# Patient Record
Sex: Female | Born: 1960 | Race: White | Hispanic: No | Smoking: Never smoker
Health system: Southern US, Community
[De-identification: ages and names within clinical notes are randomized; demographics above are authoritative.]

---

## 2015-12-06 ENCOUNTER — Ambulatory Visit (INDEPENDENT_AMBULATORY_CARE_PROVIDER_SITE_OTHER): Payer: BLUE CROSS/BLUE SHIELD | Admitting: Nurse Practitioner

## 2015-12-06 ENCOUNTER — Encounter (INDEPENDENT_AMBULATORY_CARE_PROVIDER_SITE_OTHER): Payer: Self-pay

## 2015-12-06 VITALS — BP 152/98 | HR 98 | Temp 98.8°F | Resp 16 | Wt 223.4 lb

## 2015-12-06 DIAGNOSIS — M5432 Sciatica, left side: Secondary | ICD-10-CM

## 2015-12-06 MED ORDER — TRAMADOL HCL 50 MG PO TABS
50.0000 mg | ORAL_TABLET | Freq: Three times a day (TID) | ORAL | Status: DC | PRN
Start: 2015-12-06 — End: 2016-07-24

## 2015-12-06 MED ORDER — NAPROXEN 500 MG PO TABS
500.0000 mg | ORAL_TABLET | Freq: Two times a day (BID) | ORAL | Status: DC
Start: 2015-12-06 — End: 2016-07-24

## 2015-12-06 MED ORDER — CYCLOBENZAPRINE HCL 10 MG PO TABS
10.0000 mg | ORAL_TABLET | Freq: Three times a day (TID) | ORAL | Status: DC | PRN
Start: 2015-12-06 — End: 2016-07-24

## 2015-12-06 NOTE — Progress Notes (Signed)
[facility]  PROGRESS NOTE     Patient: Alexandria Wagner   Date: 12/06/2015   MRN: [id number]       Alexandria Wagner is a 55 y.o. female      SUBJECTIVE     Chief Complaint   Patient presents with   . Hip Pain     Onset a few weeks ago with left hip pain that radiates to left knee. Pt states pain is exacerbated when sitting for long periods and upon standing.  Self tx with ibuprofen with relief.          HPI Comments: Patient reports over the last 3 weeks.  She has had pain in the left buttock that radiates down to her left knee  She reports no injury or trauma  Pain is burning/shooting in nature  She is using ibuprofen intermittently along with ice with mild relief  She denies previous injury to area  She cannot recall an injury that she did to create the pain    Reviewed history of patients medical, surgical, family and social with patient - appropriate changes made to template      Hip Pain   The incident occurred more than 1 week ago. The incident occurred at home. There was no injury mechanism. The pain is present in the left hip (Left buttock). The quality of the pain is described as aching and shooting. The pain is at a severity of 7/10. The pain is moderate. The pain has been intermittent since onset. Pertinent negatives include no inability to bear weight, loss of motion, loss of sensation, muscle weakness, numbness or tingling. She reports no foreign bodies present. The symptoms are aggravated by movement and weight bearing. She has tried ice and NSAIDs for the symptoms. The treatment provided mild relief.       Review of Systems   Constitutional: Negative.    HENT: Negative.    Respiratory: Negative.    Cardiovascular: Negative.    Musculoskeletal: Positive for myalgias and gait problem.   Skin: Negative for color change and rash.   Allergic/Immunologic: Negative for immunocompromised state.   Neurological: Negative for tingling and numbness.   Hematological: Negative.    Psychiatric/Behavioral:  Negative.        The following portions of the patient's history were reviewed and updated as appropriate: Allergies, Current Medications, Past Family History, Past Medical history, Past social history, Past surgical history, and Problem List.    OBJECTIVE     Vitals   Filed Vitals:    12/06/15 1415   BP: 152/98   Pulse: 98   Temp: 98.8 F (37.1 C)   TempSrc: Tympanic   Resp: 16   Weight: 101.334 kg (223 lb 6.4 oz)       Physical Exam   Nursing note and vitals reviewed.  Constitutional: She is oriented to person, place, and time. She appears well-developed and well-nourished. She appears distressed.   Cardiovascular: Normal rate, regular rhythm, S1 normal, S2 normal and normal heart sounds.  Exam reveals no gallop and no friction rub.    No murmur heard.  Pulmonary/Chest: Effort normal and breath sounds normal. No respiratory distress. She has no wheezes. She has no rhonchi. She has no rales.   Abdominal: Soft. She exhibits no distension. There is no tenderness. There is no guarding.   Musculoskeletal:        Lumbar back: She exhibits decreased range of motion, tenderness and pain. She exhibits no bony tenderness, no swelling, no  edema and no spasm.        Back:    Neurological: She is alert and oriented to person, place, and time.   Skin: Skin is warm and dry. No rash noted.   Psychiatric: She has a normal mood and affect. Her behavior is normal. Judgment and thought content normal.         ASSESSMENT     Encounter Diagnosis   Name Primary?   . Sciatica of left side Yes     Left buttock and hip pain x 2-3 weeks    1. Sciatica of left side  naproxen (NAPROSYN) 500 MG tablet    traMADol (ULTRAM) 50 MG tablet    cyclobenzaprine (FLEXERIL) 10 MG tablet          PLAN     Procedures    1. Sciatica of left side  - naproxen (NAPROSYN) 500 MG tablet; Take 1 tablet (500 mg total) by mouth 2 (two) times daily with meals.  Dispense: 28 tablet; Refill: 0  - traMADol (ULTRAM) 50 MG tablet; Take 1 tablet (50 mg total) by mouth  every 8 (eight) hours as needed for Pain.  Dispense: 15 tablet; Refill: 0  - cyclobenzaprine (FLEXERIL) 10 MG tablet; Take 1 tablet (10 mg total) by mouth every 8 (eight) hours as needed for Muscle spasms.  Dispense: 20 tablet; Refill: 0    You have been evaluated for inflammation of her sciatic nerve  Use ibuprofen 600 mg every 6-8 hours as needed for inflammation.  You must take this medication with food or it will upset your stomach  Use the Flexeril one tablet at night to help sleep - DO NOT take this medication a few plan on driving it will make you sleepy  Use the Tramadol for severe pain - DO NOT drink alcohol or drive when taking as it will make you sleepy  Use heat on the area.  5-6 times a day 15-20 minutes at a time    Please follow-up with your primary care provider or orthopedist if not feeling any better in the next week.  Go to the Emergency Department for any worsening symptoms or concerns.      An After Visit Summary was printed and given to the patient.      Woodfin Ganja, FNP  12/06/2015

## 2015-12-06 NOTE — Patient Instructions (Addendum)
Dear Ms Harrie Jeans:    I appreciate your choosing the [facility] in [facility] for your healthcare needs, and hope your visit today was EXCELLENT.    Instructions:    You have been evaluated for inflammation of her sciatic nerve  Use ibuprofen 600 mg every 6-8 hours as needed for inflammation.  You must take this medication with food or it will upset your stomach  Use the Flexeril one tablet at night to help sleep - DO NOT take this medication a few plan on driving it will make you sleepy  Use the Tramadol for severe pain - DO NOT drink alcohol or drive when taking as it will make you sleepy  Use heat on the area.  5-6 times a day 15-20 minutes at a time    Please follow-up with your primary care provider or orthopedist if not feeling any better in the next week.  Go to the Emergency Department for any worsening symptoms or concerns.    Below is some information that our patients often find helpful.    We wish you good health and please do not hesitate to contact us if we can ever be of any assistance.    Sincerely,  Zachary George. Renae Fickle, FNP-BC  [facility]                        Sciatica    Sciatica is a condition that causes pain in the lower back that spreads down into the buttock, hip, and leg. Sometimes the leg pain can happen without any back pain. Sciatica happens when a spinal nerve is irritated or has pressure put on it as comes out of the spinal canal in the lower back. This most often happens when a bulge or rupture of a nearby spinal disk presses on the nerve. Sciatica can also be caused by a narrowing of the spinal canal (spinal stenosis) or spasm of the muscle in the buttocks that the sciatic nerve passes through (pyriform muscle). Sciatica is also called lumbar radiculopathy.  Sciatica may begin after a sudden twisting or bending force, such as in a car accident. Or it can happen after a simple awkward movement. In either case, muscle spasm often also happens. Muscle spasm makes the  pain worse.  A healthcare provider makes a diagnosis of sciatica from your symptoms and a physical exam. Unless you had an injury from a car accident or fall, you usually won't have X-rays taken at this time. This is because the nerves and disks in your back can't be seen on an X-ray. If the provider sees signs of a compressed nerve, you will need to schedule an MRI scan as an outpatient. Signs of a compressed nerve include loss of strength in a leg.  Most sciatica gets better with medicine, exercise, and physical therapy. If your symptoms continue after at least 3 months of medical treatment, you may need surgery or injections to your lower back.  Home care  Follow these tips when caring for yourself at home:   You may need to stay in bed the first few days. But as soon as possible, begin sitting up or walking. This will help you avoid problems that come from staying in bed for long periods.   When in bed, try to find a position that is comfortable. A firm mattress is best. Try lying flat on your back with pillows under your knees. You can also try lying on  your side with your knees bent up toward your chest and a pillow between your knees.   Avoid sitting for long periods. This puts more stress on your lower back than standing or walking.   Use heat from a hot shower, hot bath, or heating pad to help ease pain. Massage can also help. You can also try using an ice pack. You can make your own ice pack by putting ice cubes in a plastic bag. Wrap the bag in a thin towel. Try both heat and cold to see which works best. Use the method that feels best for 20 minutes several times a day.   You may use acetaminophen or ibuprofen to ease pain, unless another pain medicine was prescribed. Note: If you have chronic liver or kidney disease, talk with your healthcare provider before taking these medicines. Also talk with your provider if you've had a stomach ulcer or gastrointestinal bleeding.   Use safe lifting methods.  Don't lift anything heavier than 15 pounds until all of the pain is gone.  Follow-up care  Follow up with your healthcare provider, or as advised. You may need physical therapy or additional tests.  If X-rays were taken, a radiologist will look at them. You will be told of any new findings that may affect your care.  When to seek medical advice  Call your healthcare provider right awayif any of these occur:   Pain gets worse even after taking prescribed medicine   Weakness or numbness in 1 or both legs or hips   Numbness in your groin or genital area   You can't control your bowel or bladder   Fever   Redness or swelling over your back or spine  Date Last Reviewed: 03/18/2015   2000-2016 [employer] [address] [place], [place] [address]. All rights reserved. This information is not intended as a substitute for professional medical care. Always follow your healthcare professional's instructions.

## 2016-02-21 ENCOUNTER — Ambulatory Visit (INDEPENDENT_AMBULATORY_CARE_PROVIDER_SITE_OTHER): Payer: BLUE CROSS/BLUE SHIELD | Admitting: Family

## 2016-02-21 ENCOUNTER — Encounter (INDEPENDENT_AMBULATORY_CARE_PROVIDER_SITE_OTHER): Payer: Self-pay | Admitting: Family

## 2016-02-21 VITALS — BP 137/100 | HR 73 | Temp 97.9°F | Resp 16 | Ht 66.0 in | Wt 200.0 lb

## 2016-02-21 DIAGNOSIS — J029 Acute pharyngitis, unspecified: Secondary | ICD-10-CM

## 2016-02-21 DIAGNOSIS — B029 Zoster without complications: Secondary | ICD-10-CM

## 2016-02-21 DIAGNOSIS — H9201 Otalgia, right ear: Secondary | ICD-10-CM

## 2016-02-21 LAB — POCT RAPID STREP A: Rapid Strep A Screen POCT: NEGATIVE

## 2016-02-21 MED ORDER — TRAMADOL HCL 50 MG PO TABS
50.0000 mg | ORAL_TABLET | Freq: Four times a day (QID) | ORAL | Status: DC | PRN
Start: 2016-02-21 — End: 2016-07-24

## 2016-02-21 MED ORDER — VALACYCLOVIR HCL 1 G PO TABS
1000.0000 mg | ORAL_TABLET | Freq: Three times a day (TID) | ORAL | Status: AC
Start: 2016-02-21 — End: 2016-02-28

## 2016-02-21 MED ORDER — LIDOCAINE VISCOUS 2 % MT SOLN
5.0000 mL | Freq: Four times a day (QID) | OROMUCOSAL | Status: DC | PRN
Start: 2016-02-21 — End: 2016-07-24

## 2016-02-21 MED ORDER — LIDOCAINE VISCOUS 2 % MT SOLN
10.0000 mL | Freq: Once | OROMUCOSAL | Status: AC
Start: 2016-02-21 — End: 2016-02-21
  Administered 2016-02-21: 10 mL via OROMUCOSAL

## 2016-02-21 NOTE — Patient Instructions (Signed)
Thank you for choosing [facility]  Drink plenty of fluid   Viscous lidocaine, gargle and spit   Please follow up with your primary care doctor in 3-4 days. .   Return to the clinic or Emergency Department for worsening symptoms.    Please remember to wash your hands it is the best way to reduce illness and the spread of germs.         Shingles  Shingles is a viral infection caused by the same virus as chicken pox. Anyone who has had chicken pox may get shingles later in life. The virus stays in the body, but remains dormant (asleep). Shingles often occurs in older persons or persons with lowered immunity. But it can affect anyone at any age.  Shingles starts as a tingling patch of skin on one side of the body. Small, painful blisters may then appear. The rash does not spread to the rest of the body.  Exposure to shingles cannot cause shingles. However, it can cause chicken pox in anyone who has not had chicken pox or has not been vaccinated. The contagious period ends when all blisters have crusted over (generally about 2 weeks after the illness begins).  After the blisters heal, the affected skin may be sensitive or painful for months (neuralgia). This often gradually goes away.  A shingles vaccine is available. This can help prevent shingles or make it less painful. It isgenerallyrecommended for adults over the age of 76 who have had chicken pox in the past, but who have never had shingles. Adults over 60 who have had neither chicken pox nor shingles can prevent both diseases with the chicken pox vaccine. Ask your healthcare provider about these vaccines.  Home care   Medicines may be prescribed to help relieve pain. Take these medicines as directed. Ask your healthcare provider or pharmacist before using over-the-counter medicines for helping treat pain and itching.   In certain cases, antiviral medicines may be prescribed to reduce pain, shorten the illness, and prevent neuralgia. Take these medicines  as directed.   Compresses made froma solution of cool water mixed with cornstarchorbaking sodamay help relieve pain and itching.   Gently wash skindailywith soap and water to help prevent infection. Be certain to rinse off all of the soap, which can be irritating.   Trim fingernails and try not to scratch. Scratching the sores may leave scars.   Stay home from work or school until all blisters have formed a crust and you are no longer contagious.  Follow-up care  Follow up with your healthcare provider or as directed by our staff.  When to seek medical advice   Fever of 100.39F (38C) or higher, or as directed by your healthcare provider   Affected skin is on the face or neck and any of the following occur:   Headache   Eye pain   Changes in vision   Sores near the eye   Weakness of facial muscles   Pain, redness, or swelling of a joint   Signs of skin infection: colored drainage from the sores, warmth, increasing redness, or increasing pain  Date Last Reviewed: 05/11/2014   2000-2016 [employer] [address] [place], [place] [address]. All rights reserved. This information is not intended as a substitute for professional medical care. Always follow your healthcare professional's instructions.                Pharyngitis (Sore Throat), Report Pending    Pharyngitis (sore  throat) is often due to a virus. It can also be caused by the streptococcus, or strep, bacterium, often called strep throat. Both viral and strep infectionscan cause throat pain that is worse when swallowing, aching all over with headache,and fever. Both types of infections are contagious. They may be spread by coughing, kissing,or touching others after touching your mouth or nose.  A test has been done to find out whetheryou (or your child, if your child is the patient) have strep throat. Call this facility or your healthcare provider if you were not given your test results. If the test ispositivefor strep  infection, you will need to takeantibiotic medicines. A prescription can be called into your pharmacy at that time. If the test isnegative,you probably have a viral pharyngitis. Thisdoes notneed to be treated with antibiotics.Until you receive the results of the strep test, you should stay home from work. If your child is being tested, he or she should stay home from school.  Home care   Rest at home. Drink plenty of fluids so you won't get dehydrated.   If the test is positive for strep, don't go towork or school for the first 2 days of taking the antibiotics. After this time, you will not be contagious. You can then return to work or school if you are feeling better.   Take the antibiotic medicinefor the full 10 days, even if you feel better. This is very important to make sure the infection is treated. It is also important to prevent drug-resistant germs from developing.If you were given an antibiotic shot, you won't needmore antibiotics.   For children:Use acetaminophen for fever, fussiness, or discomfort. In infants older than 43 months of age, you may use ibuprofen instead of acetaminophen.Talk with your child's healthcare provider before giving these medicines if yourchild has chronic liver or kidney disease or ever had a stomach ulcer or GI bleeding. Never give aspirin to a child under 3 years of age who is ill with a fever. It may cause severe liver damage.   For adults:Use acetaminophen or ibuprofen to control pain or fever, unless another medicine was prescribed for this. Talk with your healthcare provider before taking these medicinesif you have chronic liver or kidney disease or ever had a stomach ulcer or GI bleeding.   Use throat lozenges or numbing throat sprays to help reduce pain. Gargling with warm salt water will also help reduce throat pain. For this, dissolve 1/2 teaspoon of salt in 1 glass of warm water. To help soothe a sore throat, children can sip on juice or a  popsicle. Children 5 years and older can also suck on a lollipop or hard candy.   Don't eat salty or spicy foods. These can irritate the throat.  Follow-up care  Follow up with your healthcare provider or our staff if you don't get better over the next week.  When to seek medical advice  Call your healthcare provider right away if any of these occur:   Feveras directed by your healthcare provider. For children, seek care if:   Your child is of any age and has repeated fevers above 1063F (40C).   Your child is younger than 65 years of age and has a fever of 100.63F (38C) that continues for more than 1 day.   Your child is 23 years old or older and has a fever of 100.63F (38C) that continues for more than 3 days.   New or worsening ear pain, sinus pain, or headache  Painful lumps in the back of neck   Stiff neck   Lymph nodes are getting larger   Inability to swallow liquids, excessive drooling,or inability to open mouth wide due to throat pain   Signs of dehydration (very dark urine or no urine, sunken eyes, dizziness)   Trouble breathing or noisy breathing   Muffled voice   New rash   Child appears to be getting sicker  Date Last Reviewed: 11/27/2013   2000-2016 [employer] [address] [place], [place] [address]. All rights reserved. This information is not intended as a substitute for professional medical care. Always follow your healthcare professional's instructions.

## 2016-02-21 NOTE — Progress Notes (Signed)
[name]  PROGRESS NOTE     Patient: Alexandria Wagner   Date: 02/21/2016   MRN: [id number]       Alexandria Wagner is a 55 y.o. female      SUBJECTIVE     Chief Complaint   Patient presents with   . Otalgia     c/o right ear pain for the last 3 days. Self treat with hydrogen peroxide.   Patient complaint of right ear pain.    Patient is also complaining rash on mid gluteal area.  Noticed pain prior to rash starting.  Rash site is painful.       Otalgia   There is pain in the right ear. This is a new problem. The current episode started in the past 7 days. The problem has been rapidly worsening. There has been no fever. Associated symptoms include a rash. Pertinent negatives include no abdominal pain, coughing, diarrhea, ear discharge, headaches, hearing loss, neck pain, rhinorrhea, sore throat or vomiting. The treatment provided no relief.   Rash  This is a new problem. The current episode started in the past 7 days. The affected locations include the right buttock. The rash is characterized by blistering and pain. Pertinent negatives include no congestion, cough, diarrhea, fever, rhinorrhea, shortness of breath, sore throat or vomiting. Past treatments include nothing.       Review of Systems   Constitutional: Negative for fever, chills, diaphoresis and appetite change.   HENT: Positive for ear pain. Negative for congestion, ear discharge, hearing loss, postnasal drip, rhinorrhea, sinus pressure, sore throat and trouble swallowing.    Eyes: Negative for discharge.   Respiratory: Negative for cough, chest tightness, shortness of breath and wheezing.    Cardiovascular: Negative for chest pain.   Gastrointestinal: Negative for nausea, vomiting, abdominal pain and diarrhea.   Musculoskeletal: Negative for myalgias, arthralgias, neck pain and neck stiffness.   Skin: Positive for rash.   Allergic/Immunologic: Negative for environmental allergies.   Neurological: Negative for dizziness and headaches.   Hematological:  Negative for adenopathy.   Psychiatric/Behavioral: Negative for confusion and sleep disturbance.       The following portions of the patient's history were reviewed and updated as appropriate: Allergies, Current Medications, Past Family History, Past Medical history, Past social history, Past surgical history, and Problem List.    OBJECTIVE     Vitals   Filed Vitals:    02/21/16 1031   BP: 137/100   Pulse: 73   Temp: 97.9 F (36.6 C)   TempSrc: Tympanic   Resp: 16   Height: 1.676 m (5\' 6" )   Weight: 90.719 kg (200 lb)   SpO2: 98%       Physical Exam   Nursing note and vitals reviewed.  Constitutional: She is oriented to person, place, and time. She appears well-developed and well-nourished.   HENT:   Head: Normocephalic and atraumatic.   Right Ear: External ear normal.   Left Ear: External ear normal.   Nose: Nose normal.   Mouth/Throat: Posterior oropharyngeal erythema present. No oropharyngeal exudate.       Eyes: Pupils are equal, round, and reactive to light.   Neck: Normal range of motion. Neck supple.   Cardiovascular: Normal rate and regular rhythm.    Pulmonary/Chest: Effort normal and breath sounds normal. No respiratory distress. She has no wheezes. She exhibits no tenderness.   Abdominal: Soft. Bowel sounds are normal. She exhibits no distension and no mass. There is no tenderness. There is  no guarding.   Musculoskeletal: Normal range of motion. She exhibits no edema or tenderness.   Neurological: She is alert and oriented to person, place, and time. She has normal reflexes.   Skin: Skin is warm and dry. Rash noted. Rash is vesicular. No erythema.        Psychiatric: She has a normal mood and affect.       Lab Results (24 Hour)   Results     ** No results found for the last 24 hours. **          Radiology Results (24 Hour)     ** No results found for the last 24 hours. **          ASSESSMENT     No diagnosis found.       PLAN     Procedures    1. Herpes zoster without complication  Valtrex   Tramadol for  pain     2. Pharyngitis, unspecified etiology  - Rapid Group A Strep  - lidocaine viscous (XYLOCAINE) 2 % mouth solution 10 mL; Use as directed 10 mLs in the mouth or throat once.        3. Otalgia of right ear  - lidocaine viscous (XYLOCAINE) 2 % mouth solution 10 mL; Use as directed 10 mLs in the mouth or throat once.    Ear pain resolved after viscous lidocaine   Increase fluid intake   Viscous lidocaine       An After Visit Summary was printed and given to the patient.      Signed,  Rexford Maus, NP  02/21/2016

## 2016-07-18 ENCOUNTER — Encounter (INDEPENDENT_AMBULATORY_CARE_PROVIDER_SITE_OTHER): Payer: Self-pay

## 2016-07-24 ENCOUNTER — Encounter (INDEPENDENT_AMBULATORY_CARE_PROVIDER_SITE_OTHER): Payer: Self-pay | Admitting: Internal Medicine

## 2016-07-24 ENCOUNTER — Telehealth (INDEPENDENT_AMBULATORY_CARE_PROVIDER_SITE_OTHER): Payer: Self-pay | Admitting: Internal Medicine

## 2016-07-24 ENCOUNTER — Ambulatory Visit (FREE_STANDING_LABORATORY_FACILITY): Payer: BLUE CROSS/BLUE SHIELD | Admitting: Internal Medicine

## 2016-07-24 VITALS — BP 160/100 | HR 83 | Temp 98.0°F | Ht 62.99 in | Wt 218.0 lb

## 2016-07-24 DIAGNOSIS — Z853 Personal history of malignant neoplasm of breast: Secondary | ICD-10-CM

## 2016-07-24 DIAGNOSIS — R030 Elevated blood-pressure reading, without diagnosis of hypertension: Secondary | ICD-10-CM

## 2016-07-24 DIAGNOSIS — G44221 Chronic tension-type headache, intractable: Secondary | ICD-10-CM

## 2016-07-24 DIAGNOSIS — Z789 Other specified health status: Secondary | ICD-10-CM

## 2016-07-24 LAB — CBC AND DIFFERENTIAL
Absolute NRBC: 0 10*3/uL
Basophils Absolute Automated: 0.1 10*3/uL (ref 0.00–0.20)
Basophils Automated: 1.4 %
Eosinophils Absolute Automated: 0.12 10*3/uL (ref 0.00–0.70)
Eosinophils Automated: 1.7 %
Hematocrit: 41.9 % (ref 37.0–47.0)
Hgb: 13.9 g/dL (ref 12.0–16.0)
Immature Granulocytes Absolute: 0.02 10*3/uL
Immature Granulocytes: 0.3 %
Lymphocytes Absolute Automated: 1.43 10*3/uL (ref 0.50–4.40)
Lymphocytes Automated: 20.6 %
MCH: 29.3 pg (ref 28.0–32.0)
MCHC: 33.2 g/dL (ref 32.0–36.0)
MCV: 88.4 fL (ref 80.0–100.0)
MPV: 9.8 fL (ref 9.4–12.3)
Monocytes Absolute Automated: 0.6 10*3/uL (ref 0.00–1.20)
Monocytes: 8.6 %
Neutrophils Absolute: 4.68 10*3/uL (ref 1.80–8.10)
Neutrophils: 67.4 %
Nucleated RBC: 0 /100 WBC (ref 0.0–1.0)
Platelets: 312 10*3/uL (ref 140–400)
RBC: 4.74 10*6/uL (ref 4.20–5.40)
RDW: 13 % (ref 12–15)
WBC: 6.95 10*3/uL (ref 3.50–10.80)

## 2016-07-24 MED ORDER — CYCLOBENZAPRINE HCL 10 MG PO TABS
10.0000 mg | ORAL_TABLET | Freq: Three times a day (TID) | ORAL | 0 refills | Status: DC | PRN
Start: 2016-07-24 — End: 2016-07-31

## 2016-07-24 NOTE — Progress Notes (Signed)
Subjective:      Date: 07/24/2016 1:31 PM   Patient ID: Alexandria Wagner is a 55 y.o. female.    Chief Complaint:  Chief Complaint   Patient presents with   . Headache     x middle of nevember, not going away. Pt has been taking Motrin, wrapping her neck in ice and heat, she is also go to a chiropractor, she is having troule sleeping    . Sinusitis       HPI:55 year old woman here first office visit . Seen at urgent care last week with headache sinus pressure treated for sinusitis. Headache has persisted from back of neck up. No trauma or falls no fever no nausea or vomiting . No history of headaches . History of breast cancer s/p v lumpectomy and radiation. Works Health and safety inspector job. From [place] . Had pap . Colonoscopy. Getting chiropractor ajustments on neck for 1 week  HPI    Problem List:  Patient Active Problem List   Diagnosis   . Limb alert care status: Do Not Use Left ARM        Current Medications:  Current Outpatient Prescriptions   Medication Sig Dispense Refill   . aspirin 325 MG tablet Take 325 mg by mouth daily.     . cyclobenzaprine (FLEXERIL) 10 MG tablet Take 1 tablet (10 mg total) by mouth 3 (three) times daily as needed for Muscle spasms.May cause drowsiness 20 tablet 0     No current facility-administered medications for this visit.        Allergies:  No Known Allergies    Past Medical History:  History reviewed. No pertinent past medical history.    Past Surgical History:  Past Surgical History:   Procedure Laterality Date   . LUMPECTOMY      left breast   . TONSILLECTOMY AND ADENOIDECTOMY         Family History:  Family History   Problem Relation Age of Onset   . Breast cancer Mother    . No known problems Father    . Breast cancer Sister    . Breast cancer Maternal Grandmother    . Breast cancer Other        Social History:  Social History     Social History   . Marital status: Married     Spouse name: N/A   . Number of children: N/A   . Years of education: N/A     Occupational History   . Not on file.      Social History Main Topics   . Smoking status: Never Smoker   . Smokeless tobacco: Never Used   . Alcohol use Yes      Comment: occassionally   . Drug use: No   . Sexual activity: Not on file     Other Topics Concern   . Not on file     Social History Narrative   . No narrative on file       The following portions of the patient's history were reviewed and updated as appropriate: allergies, current medications, past family history, past medical history, past social history, past surgical history and problem list.    Vitals:  BP (!) 160/100 (BP Site: Right arm, Patient Position: Sitting, Cuff Size: Large)   Pulse 83   Temp 98 F (36.7 C) (Oral)   Ht 1.6 m (5' 2.99")   Wt 98.9 kg (218 lb)   SpO2 97%   BMI 38.63 kg/m  ROS:  Review of Systems   Constitutional: Negative.    Eyes: Negative.    Cardiovascular: Negative.    Gastrointestinal: Negative.    Genitourinary: Negative.    Musculoskeletal: Negative.    Skin: Negative.    Neurological: Positive for headaches.   Endo/Heme/Allergies: Negative.    Psychiatric/Behavioral: Negative.          Objective:       Physical Exam:  Physical Exam   Constitutional: She appears well-developed and well-nourished.   HENT:   Head: Normocephalic and atraumatic.   Neck: Normal range of motion. No JVD present.   Cardiovascular: Normal rate, regular rhythm and normal heart sounds.    Pulmonary/Chest: Effort normal.   Abdominal: Soft. Bowel sounds are normal.   Musculoskeletal: Normal range of motion. She exhibits no edema.   Neurological: She is alert.   Skin: Skin is warm and dry.   Psychiatric: She has a normal mood and affect.   Nursing note and vitals reviewed.        Assessment/Plan:       1. Limb alert care status: Do Not Use Left ARM     2. Chronic tension-type headache, intractable  - cyclobenzaprine (FLEXERIL) 10 MG tablet; Take 1 tablet (10 mg total) by mouth 3 (three) times daily as needed for Muscle spasms.May cause drowsiness  Dispense: 20 tablet; Refill:  0  - CBC and differential  - Comprehensive metabolic panel  - TSH  - Vitamin D,25 OH, Total  - Vitamin B12  - Sedimentation rate (ESR)  Tension headaches with sinus congestion , complete antibiotics , add flexeril qhs and cordicidin  . Check labs  Return 1 week  3. Elevated blood pressure reading  bp 150/90 high today no history of hypertension recheck here `1 week low salt no further advil     4. History of breast cancer stable          Clarnce Flock, MD

## 2016-07-24 NOTE — Telephone Encounter (Signed)
Pt thinks she missed the copay and would like to pay. Can be reached at [phone]

## 2016-07-24 NOTE — Progress Notes (Signed)
Have you seen any specialists/other providers since your last visit with us?    No      Limb alert protocol reviewed?      Yes

## 2016-07-25 LAB — COMPREHENSIVE METABOLIC PANEL
ALT: 15 U/L (ref 0–55)
AST (SGOT): 13 U/L (ref 5–34)
Albumin/Globulin Ratio: 1 (ref 0.9–2.2)
Albumin: 3.3 g/dL — ABNORMAL LOW (ref 3.5–5.0)
Alkaline Phosphatase: 65 U/L (ref 37–106)
BUN: 13 mg/dL (ref 7.0–19.0)
Bilirubin, Total: 0.3 mg/dL (ref 0.1–1.2)
CO2: 24 mEq/L (ref 21–29)
Calcium: 8.8 mg/dL (ref 8.5–10.5)
Chloride: 108 mEq/L (ref 100–111)
Creatinine: 0.9 mg/dL (ref 0.4–1.5)
Globulin: 3.3 g/dL (ref 2.0–3.7)
Glucose: 101 mg/dL — ABNORMAL HIGH (ref 70–100)
Potassium: 3.9 mEq/L (ref 3.5–5.1)
Protein, Total: 6.6 g/dL (ref 6.0–8.3)
Sodium: 142 mEq/L (ref 136–145)

## 2016-07-25 LAB — TSH: TSH: 1.17 u[IU]/mL (ref 0.35–4.94)

## 2016-07-25 LAB — SEDIMENTATION RATE: Sed Rate: 17 mm/Hr (ref 0–20)

## 2016-07-25 LAB — HEMOLYSIS INDEX: Hemolysis Index: 7 (ref 0–18)

## 2016-07-25 LAB — GFR: EGFR: >60

## 2016-07-25 LAB — VITAMIN D,25 OH,TOTAL: Vitamin D, 25 OH, Total: 44 ng/mL (ref 30–100)

## 2016-07-25 LAB — VITAMIN B12: Vitamin B-12: 802 pg/mL (ref 211–911)

## 2016-07-31 ENCOUNTER — Encounter (INDEPENDENT_AMBULATORY_CARE_PROVIDER_SITE_OTHER): Payer: Self-pay | Admitting: Internal Medicine

## 2016-07-31 ENCOUNTER — Ambulatory Visit (INDEPENDENT_AMBULATORY_CARE_PROVIDER_SITE_OTHER): Payer: BLUE CROSS/BLUE SHIELD | Admitting: Internal Medicine

## 2016-07-31 VITALS — HR 81 | Temp 98.2°F | Wt 219.8 lb

## 2016-07-31 DIAGNOSIS — F419 Anxiety disorder, unspecified: Secondary | ICD-10-CM

## 2016-07-31 DIAGNOSIS — R519 Headache, unspecified: Secondary | ICD-10-CM

## 2016-07-31 DIAGNOSIS — I10 Essential (primary) hypertension: Secondary | ICD-10-CM

## 2016-07-31 DIAGNOSIS — G44221 Chronic tension-type headache, intractable: Secondary | ICD-10-CM

## 2016-07-31 DIAGNOSIS — R51 Headache: Secondary | ICD-10-CM

## 2016-07-31 MED ORDER — LOSARTAN POTASSIUM 50 MG PO TABS
50.0000 mg | ORAL_TABLET | Freq: Every day | ORAL | 11 refills | Status: AC
Start: 2016-07-31 — End: ?

## 2016-07-31 MED ORDER — CYCLOBENZAPRINE HCL 10 MG PO TABS
10.0000 mg | ORAL_TABLET | Freq: Every evening | ORAL | 0 refills | Status: AC
Start: 2016-07-31 — End: ?

## 2016-07-31 NOTE — Progress Notes (Signed)
Have you seen any specialists/other providers since your last visit with [place]?    Yes, Chiropractor 07/27/16 & 07/29/16        Limb alert protocol reviewed?      Yes

## 2016-07-31 NOTE — Progress Notes (Signed)
Subjective:      Date: 07/31/2016 8:55 AM   Patient ID:  Lafalce is a 55 y.o. female.    Chief Complaint:  Chief Complaint   Patient presents with   . Headache     Patient stated that she went 4 days without a headache.    . Hypertension     f/u       HPI:  54 year old woman here  For follow up chronic headache. Let for 4 days but now returned . No history of persistant , cluster or migraine headaches. No nausea or vomiting. Also blood pressure high again   HPI    Problem List:  Patient Active Problem List   Diagnosis   . Limb alert care status: Do Not Use Left ARM        Current Medications:  Current Outpatient Prescriptions   Medication Sig Dispense Refill   . aspirin 325 MG tablet Take 325 mg by mouth daily.     . cyclobenzaprine (FLEXERIL) 10 MG tablet Take 1 tablet (10 mg total) by mouth 3 (three) times daily as needed for Muscle spasms.May cause drowsiness 20 tablet 0   . losartan (COZAAR) 50 MG tablet Take 1 tablet (50 mg total) by mouth daily. 30 tablet 11     No current facility-administered medications for this visit.        Allergies:  No Known Allergies    Past Medical History:  History reviewed. No pertinent past medical history.    Past Surgical History:  Past Surgical History:   Procedure Laterality Date   . LUMPECTOMY      left breast   . TONSILLECTOMY AND ADENOIDECTOMY         Family History:  Family History   Problem Relation Age of Onset   . Breast cancer Mother    . No known problems Father    . Breast cancer Sister    . Breast cancer Maternal Grandmother    . Breast cancer Other        Social History:  Social History     Social History   . Marital status: Divorced     Spouse name: N/A   . Number of children: N/A   . Years of education: N/A     Occupational History   . Not on file.     Social History Main Topics   . Smoking status: Never Smoker   . Smokeless tobacco: Never Used   . Alcohol use Yes      Comment: occassionally   . Drug use: No   . Sexual activity: Not on file     Other  Topics Concern   . Not on file     Social History Narrative   . No narrative on file       The following portions of the patient's history were reviewed and updated as appropriate: allergies, current medications, past family history, past medical history, past social history, past surgical history and problem list.    Vitals:  Pulse 81   Temp 98.2 F (36.8 C) (Oral)   Wt 99.7 kg (219 lb 12.8 oz)   SpO2 98%   BMI 38.95 kg/m       ROS:  Review of Systems   Constitutional: Negative.    Eyes: Negative.    Cardiovascular: Negative.    Gastrointestinal: Negative.    Genitourinary: Negative.    Musculoskeletal: Negative.    Skin: Negative.    Neurological: Positive for headaches.  Endo/Heme/Allergies: Negative.    Psychiatric/Behavioral: Negative.          Objective:       Physical Exam:  Physical Exam   Constitutional: She appears well-developed and well-nourished.   HENT:   Head: Normocephalic and atraumatic.   Neck: Normal range of motion. No JVD present.   Cardiovascular: Normal rate, regular rhythm and normal heart sounds.    Pulmonary/Chest: Effort normal.   Abdominal: Soft. Bowel sounds are normal.   Musculoskeletal: Normal range of motion. She exhibits no edema.   Neurological: She is alert.   Skin: Skin is warm and dry.   Psychiatric: She has a normal mood and affect.   Nursing note and vitals reviewed.        Assessment/Plan:       1. Essential hypertension  - losartan (COZAAR) 50 MG tablet; Take 1 tablet (50 mg total) by mouth daily.  Dispense: 30 tablet; Refill: 11  startr losartan 50 qd , low salt diet  Monitor at home recheck 2 weeks  2. Chronic intractable headache, unspecified headache type  - MRI Brain WO Contrast; Future  Image head because headache persist greater than 1 month . If negative and if headache persist see neurology        Clarnce Flock, MD

## 2016-08-03 ENCOUNTER — Telehealth (INDEPENDENT_AMBULATORY_CARE_PROVIDER_SITE_OTHER): Payer: Self-pay

## 2016-08-03 MED ORDER — ALPRAZOLAM 0.5 MG PO TABS
0.5000 mg | ORAL_TABLET | Freq: Two times a day (BID) | ORAL | 1 refills | Status: AC | PRN
Start: 2016-08-03 — End: ?

## 2016-08-03 NOTE — Telephone Encounter (Signed)
-----   Message from Barbette Merino sent at 08/03/2016  3:36 PM EST -----  Regarding: Claustrophobic Patient Needs Something to have MRI  Contact: [phone]  Patient is scheduled to have a MRI tomorrow and because she is claustrophobic she was told to call Dr. Renato Gails and ask him to prescribe something for her.  She uses the pharmacy on file.  She would like a call to confirm something was sent to the pharmacy.      Please call and advise.    Contact Number:  [phone]

## 2016-08-03 NOTE — Telephone Encounter (Signed)
Dr.Reed approved:      ALPRAZolam (XANAX) 0.5 MG tablet 30 tablet 1 08/03/2016     Sig - Route: Take 1 tablet (0.5 mg total) by mouth 2 (two) times daily as needed for Anxiety. - Oral    Class: Print    Associated Diagnoses     Anxiety       Pharmacy     [id number] - [place], [place] - [address] AT [address]         Done the Script above has been called in to [facility].     Patient has been notified of the approval.

## 2016-08-03 NOTE — Addendum Note (Signed)
Addended by: Lujean Rave T on: 08/03/2016 04:28 PM     Modules accepted: Orders

## 2016-08-04 ENCOUNTER — Ambulatory Visit: Payer: BLUE CROSS/BLUE SHIELD

## 2019-11-10 IMAGING — MR MR BREAST BILAT WO/W CM
8 of 11 series · 29 of 48 positions shown · IV contrast (10 GADAVIST)
Comparison: Previous exam(s).

CLINICAL DATA: 58-year-old female with history of treated left
breast cancer and right breast high risk lesion.

LABS:  None performed on site.
EXAM:
BILATERAL BREAST MRI WITH AND WITHOUT CONTRAST
TECHNIQUE: Multiplanar, multisequence MR images of both breasts were obtained
prior to and following the intravenous administration of 10 ml of
Gadavist.

[Series 3: T2 · axial · 3.0mm · 0.89mm/px · z∈[-75,+102]mm · 2 of 59 slices shown]
[im 1/59]
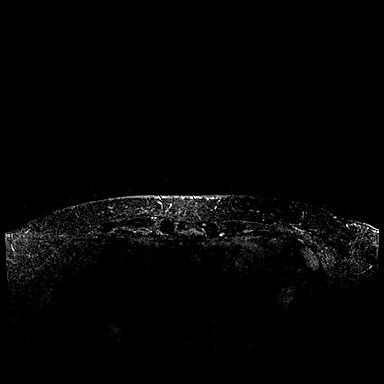
[im 59/59]
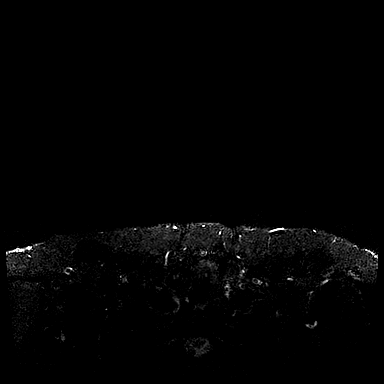

[Series 5: T1 fat-sat · axial · 1.2mm · 0.78mm/px · z∈[-73,+99]mm · 5 of 144 slices shown (1 of 5)]
[im 1/144]
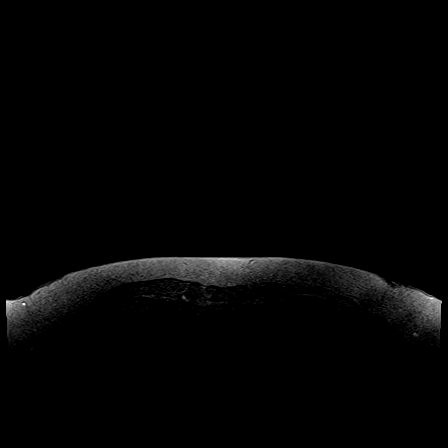
[im 36/144]
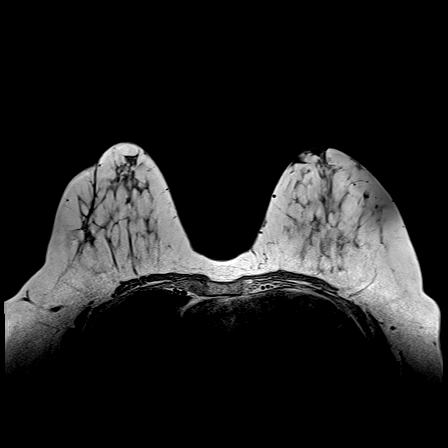
[im 72/144]
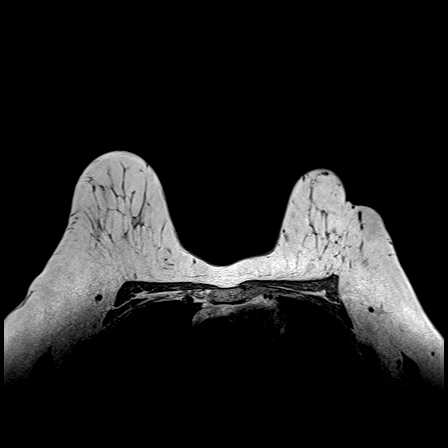
[im 108/144]
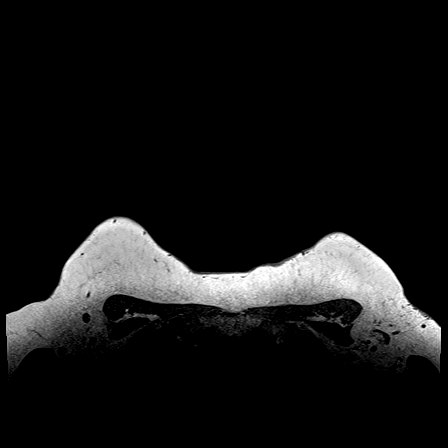
[im 144/144]
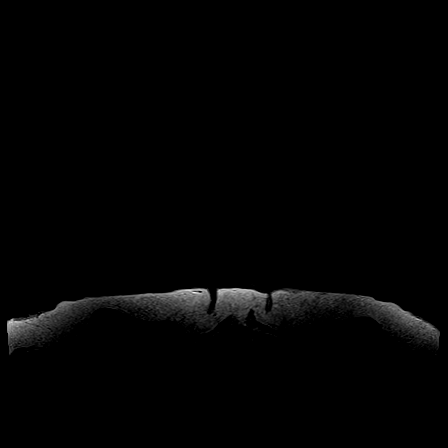

[Series 6: T1 fat-sat · axial · 1.6mm · 0.87mm/px · z∈[-79,+98]mm · 5 of 112 slices shown (2 of 5)]
[im 1/112]
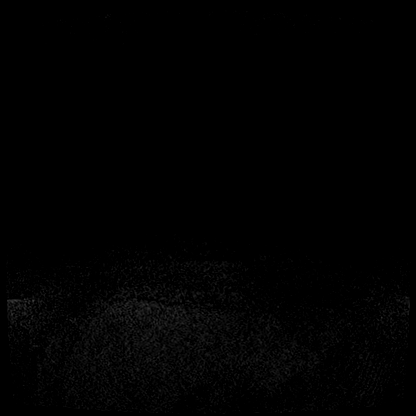
[im 28/112]
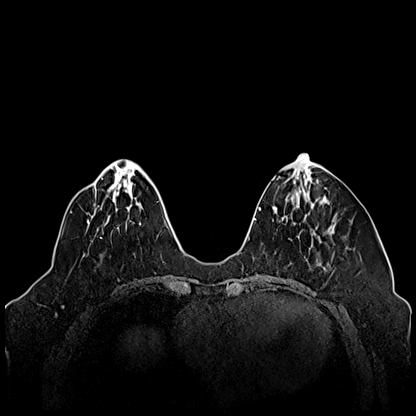
[im 56/112]
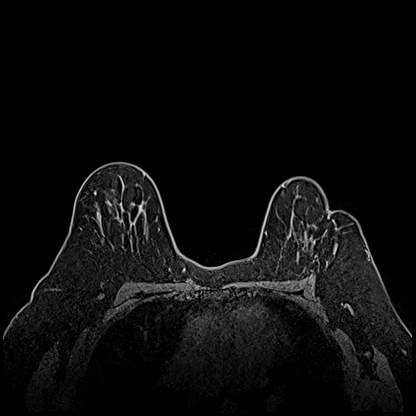
[im 84/112]
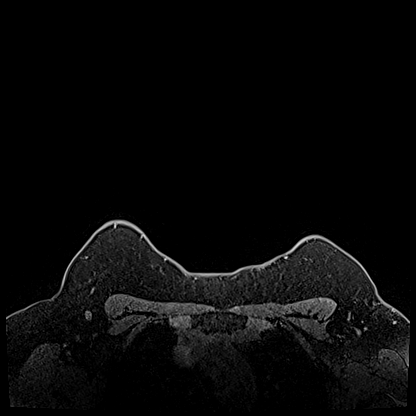
[im 112/112]
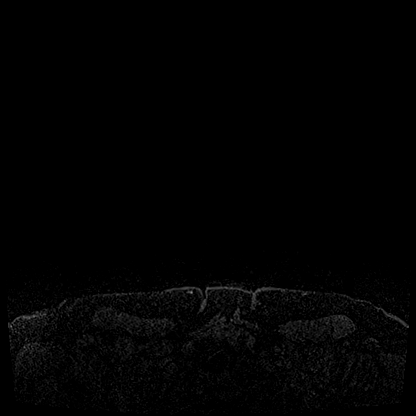

[Series 7: T1 fat-sat · axial · 1.6mm · 0.87mm/px · z∈[-79,+98]mm · 5 of 112 slices shown (3 of 5)]
[im 1/112]
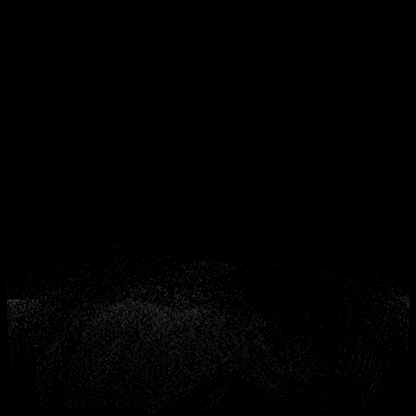
[im 28/112]
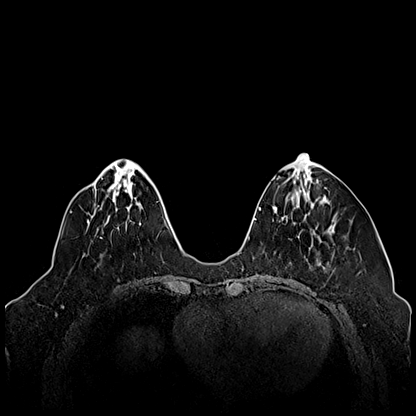
[im 56/112]
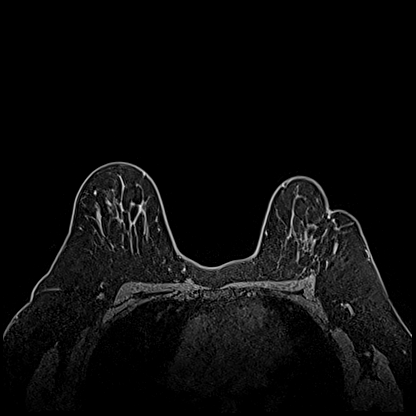
[im 84/112]
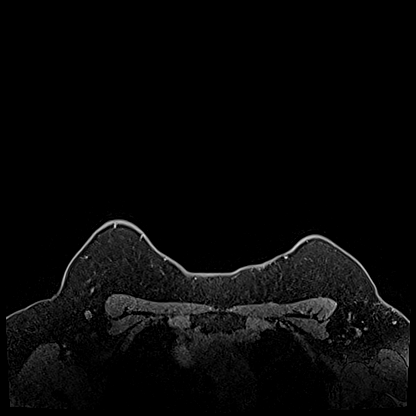
[im 112/112]
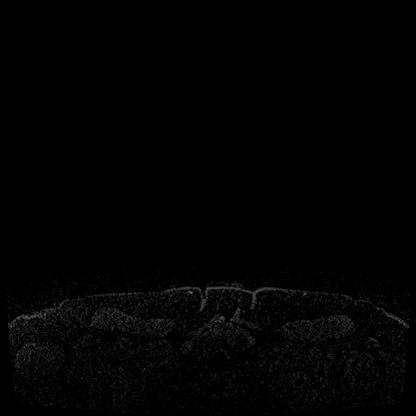

[Series 8: T1 fat-sat · axial · 1.6mm · 0.87mm/px · z∈[-79,+98]mm · 5 of 112 slices shown (4 of 5)]
[im 1/112]
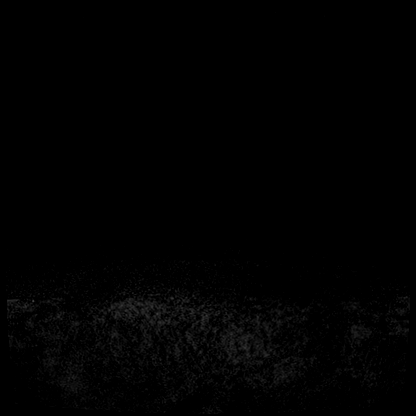
[im 28/112]
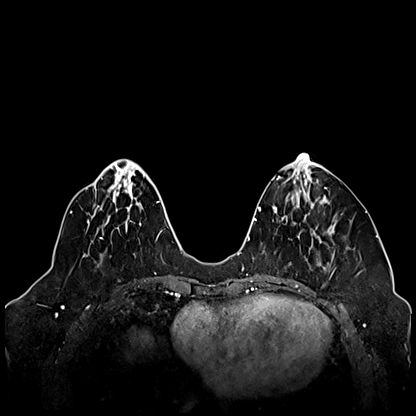
[im 56/112]
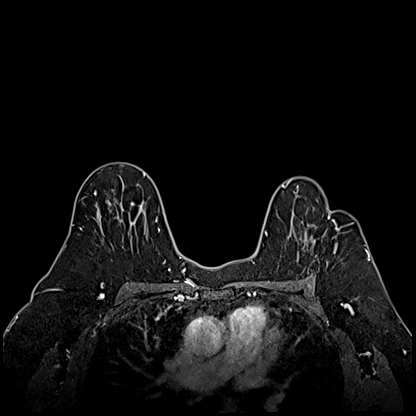
[im 84/112]
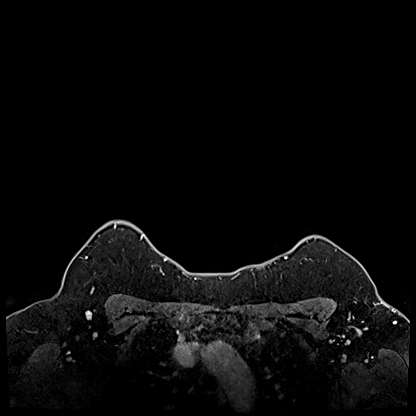
[im 112/112]
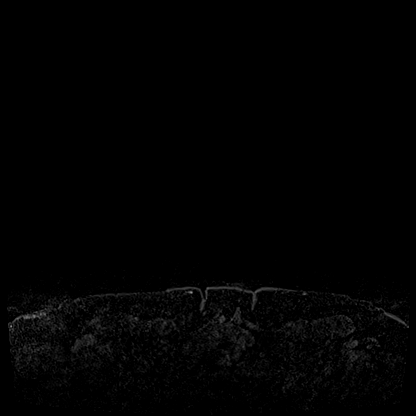

[Series 9: T1 · axial · 1.6mm · 0.87mm/px · z∈[-79,+98]mm · 5 of 112 slices shown (1 of 2)]
[im 1/112]
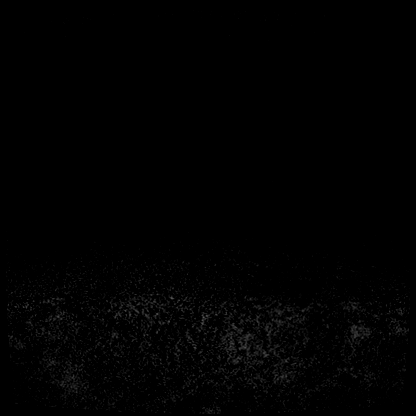
[im 28/112]
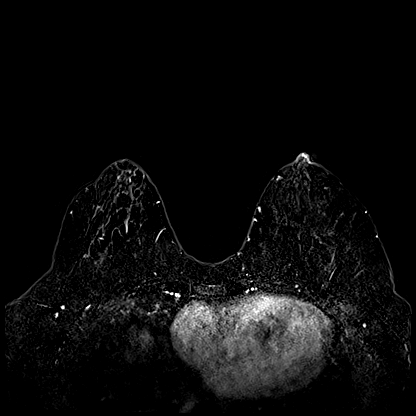
[im 56/112]
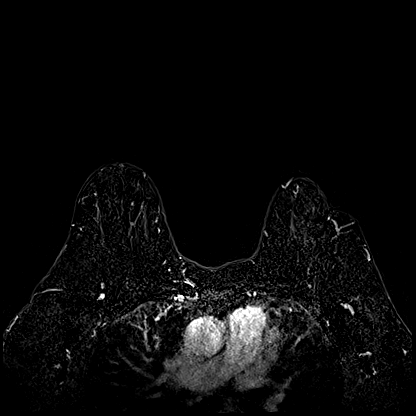
[im 84/112]
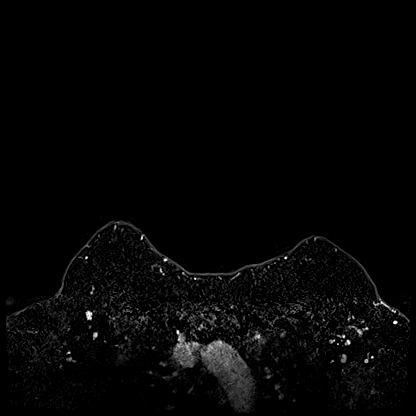
[im 112/112]
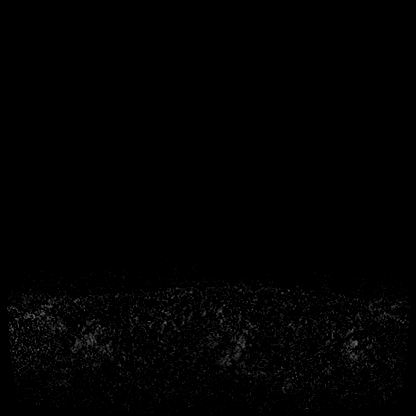

[Series 11: T1 · axial · 179.2mm · 0.87mm/px · 1 of 3 slices shown (2 of 2)]
[im 1/3]
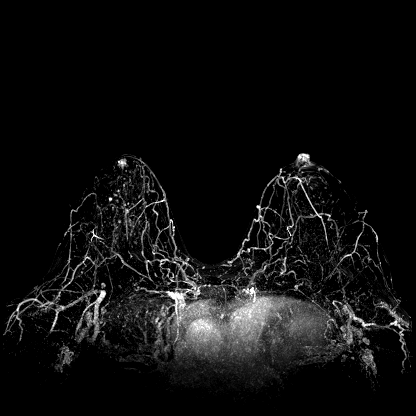

[Series 12: T1 fat-sat · axial · 1.6mm · 0.87mm/px · 1 of 112 slices shown (5 of 5)]
[im 1/112]
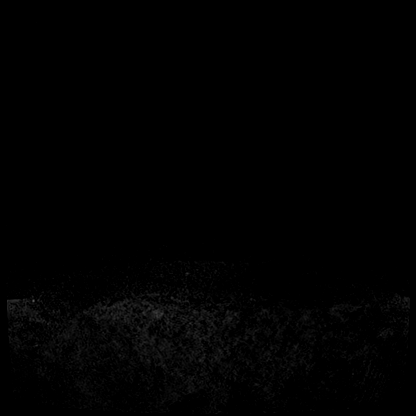

[29 of 48 positions shown; findings below may reference images not displayed]

Three-dimensional MR images were rendered by post-processing of the
original MR data on an independent workstation. The
three-dimensional MR images were interpreted, and findings are
reported in the following complete MRI report for this study. Three
dimensional images were evaluated at the independent Dyna[employer]
workstation
FINDINGS: Breast composition: b. Scattered fibroglandular tissue.

Background parenchymal enhancement: Mild.

Right breast: No suspicious mass or abnormal enhancement.
Postsurgical changes noted in the lower outer quadrant.

Left breast: No suspicious mass or abnormal enhancement.
Postsurgical changes noted in the upper outer quadrant.

Lymph nodes: No abnormal appearing lymph nodes.

Ancillary findings: 3.8 cm nonenhancing hepatic cyst in the right
lobe. No additional incidental findings.
IMPRESSION: Bilateral postsurgical changes without MRI evidence of malignancy in
either breast.

RECOMMENDATION:
Routine annual screening. The patient is due for her next bilateral
mammogram in [DATE].

BI-RADS CATEGORY  2: Benign.

## 2021-06-03 IMAGING — MR MR BREAST BILAT WO/W CM
6 of 9 series · 29 of 48 positions shown · IV contrast (gadavist)
Comparison: Bilateral breast MRI dated [DATE].

CLINICAL DATA: Previous left lumpectomy for breast cancer and right
breast excision for a high risk lesion. Family history of breast
cancer.

LABS:  None obtained on site today.
EXAM:
BILATERAL BREAST MRI WITH AND WITHOUT CONTRAST
TECHNIQUE: Multiplanar, multisequence MR images of both breasts were obtained
prior to and following the intravenous administration of 10 ml of
Gadavist

[Series 2: T2 · axial · 3.0mm · 0.96mm/px · z∈[-65,+97]mm · 2 of 55 slices shown]
[im 1/55]
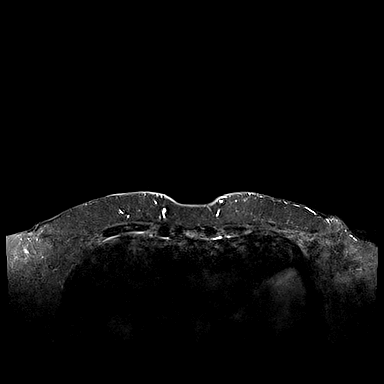
[im 55/55]
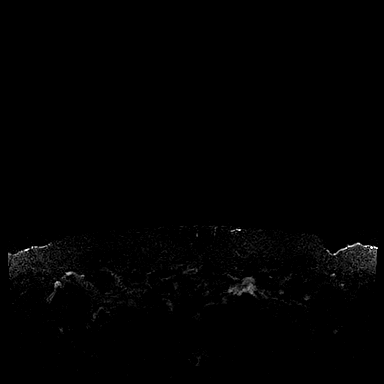

[Series 3: T1 fat-sat · axial · 1.2mm · 0.83mm/px · z∈[-70,+102]mm · 6 of 144 slices shown (1 of 4)]
[im 1/144]
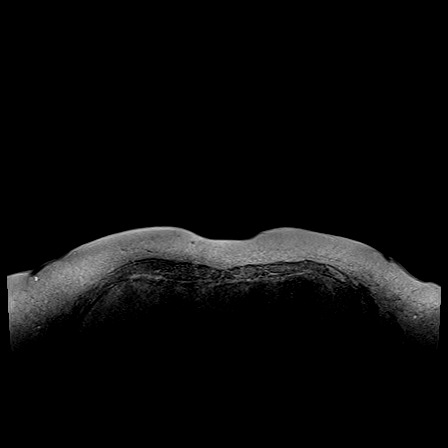
[im 29/144]
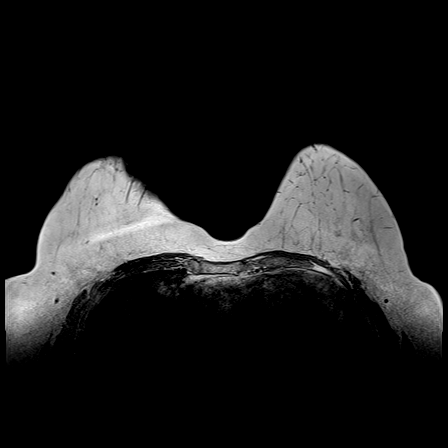
[im 58/144]
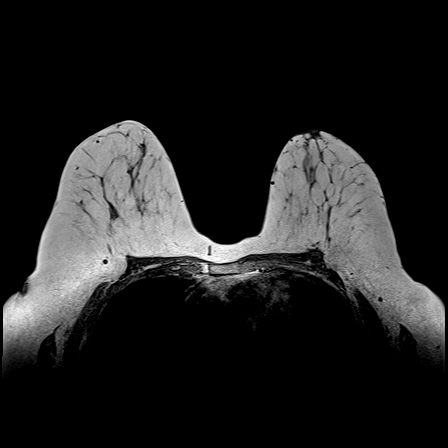
[im 86/144]
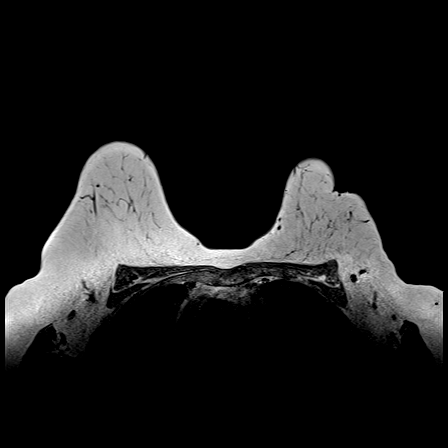
[im 115/144]
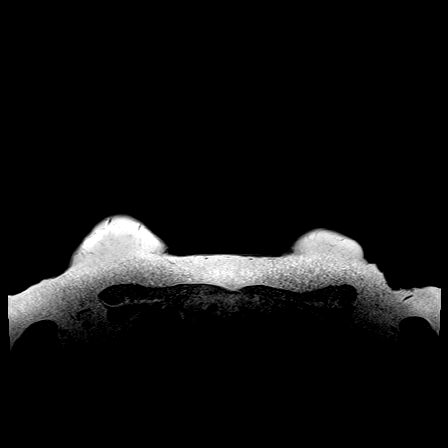
[im 144/144]
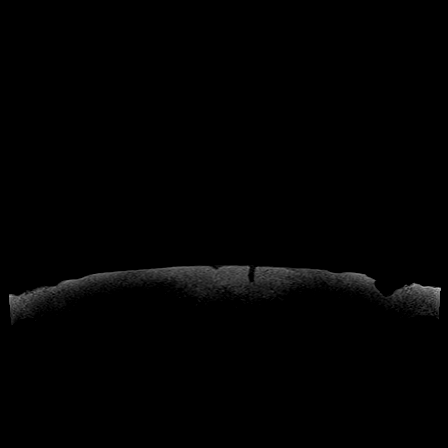

[Series 5: T1 fat-sat · axial · 1.2mm · 0.89mm/px · z∈[-57,+86]mm · 5 of 120 slices shown (2 of 4)]
[im 1/120]
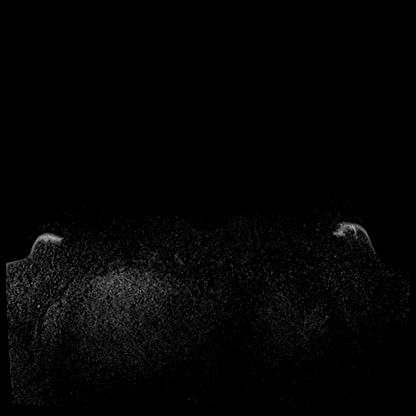
[im 30/120]
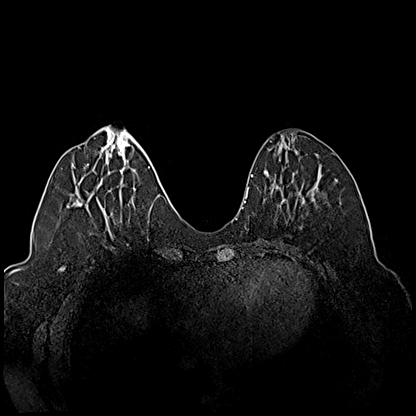
[im 60/120]
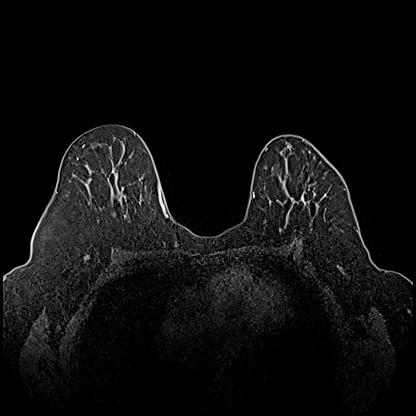
[im 90/120]
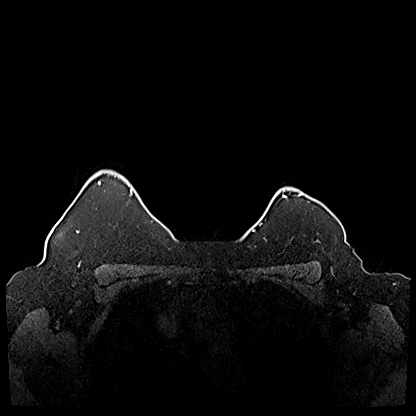
[im 120/120]
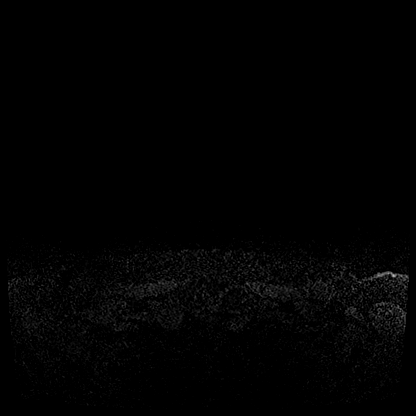

[Series 6: T1 fat-sat · axial · 1.2mm · 0.89mm/px · z∈[-57,+86]mm · 5 of 120 slices shown (3 of 4)]
[im 1/120]
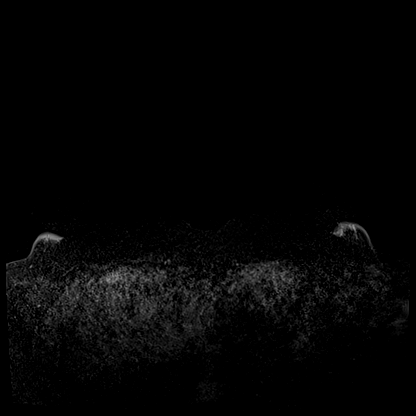
[im 30/120]
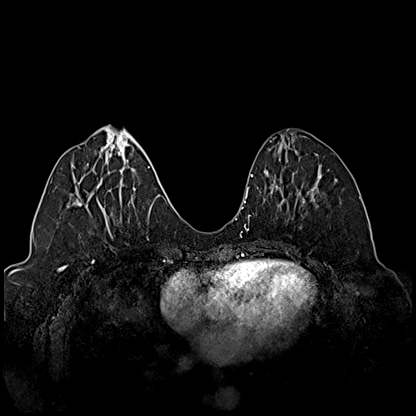
[im 60/120]
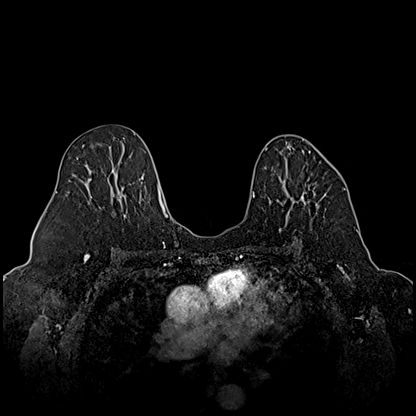
[im 90/120]
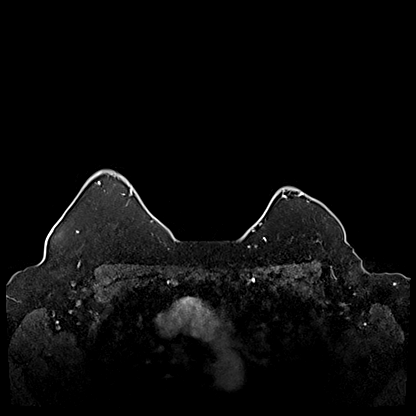
[im 120/120]
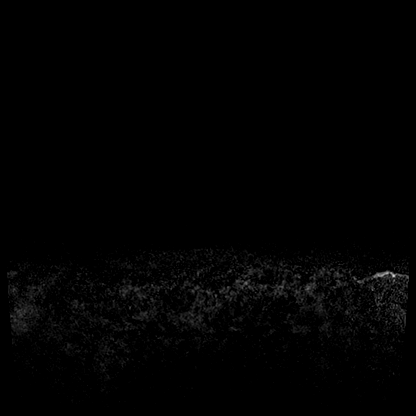

[Series 7: T1 · axial · 1.2mm · 0.89mm/px · z∈[-57,+86]mm · 6 of 120 slices shown]
[im 1/120]
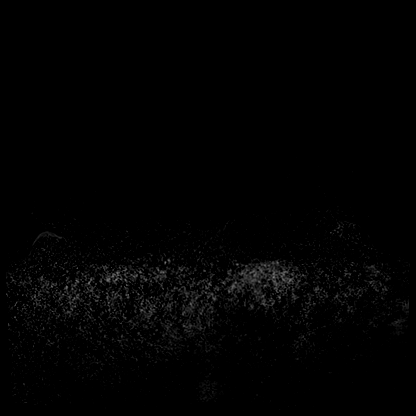
[im 24/120]
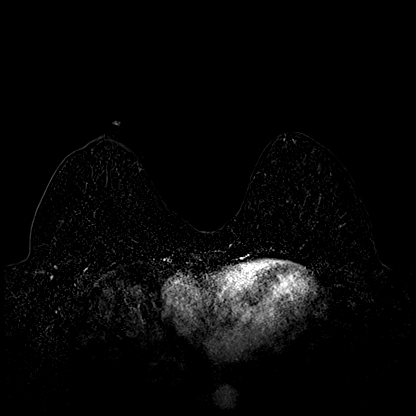
[im 48/120]
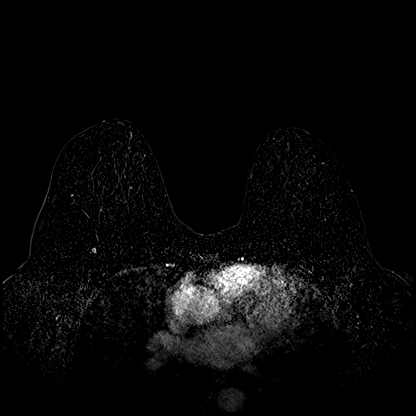
[im 72/120]
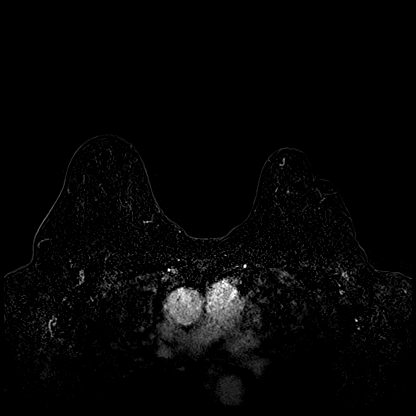
[im 96/120]
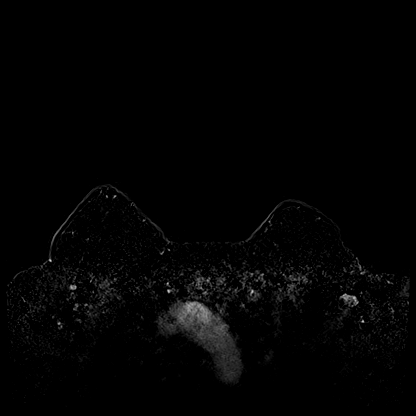
[im 120/120]
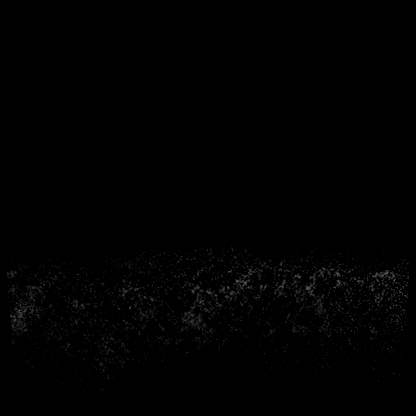

[Series 10: T1 fat-sat · axial · 1.2mm · 0.89mm/px · z∈[-57,+57]mm · 5 of 120 slices shown (4 of 4)]
[im 1/120]
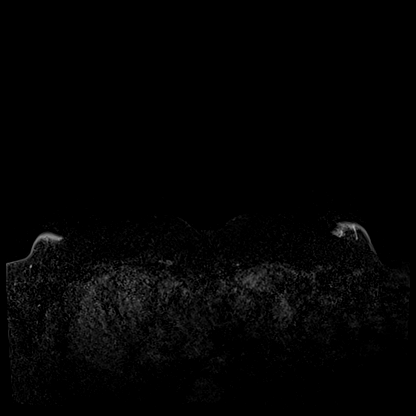
[im 24/120]
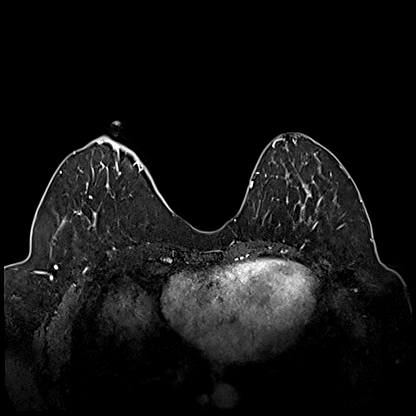
[im 48/120]
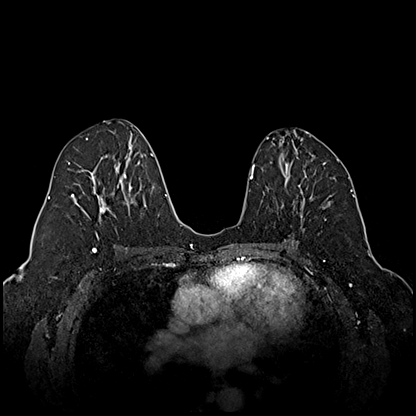
[im 72/120]
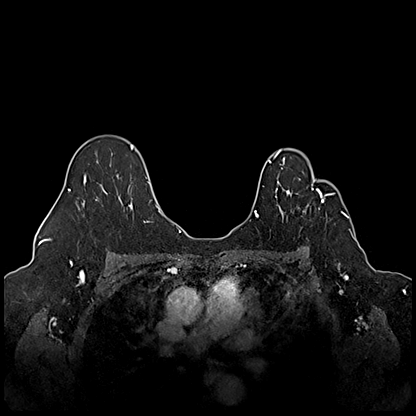
[im 96/120]
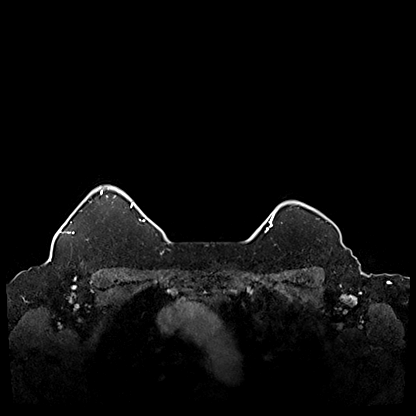

[29 of 48 positions shown; findings below may reference images not displayed]

Three-dimensional MR images were rendered by post-processing of the
original MR data on an independent workstation. The
three-dimensional MR images were interpreted, and findings are
reported in the following complete MRI report for this study. Three
dimensional images were evaluated at the independent interpreting
workstation using the DynaCAD thin client.
The patient had
outside mammogram and ultrasound examinations on [DATE]. There
are no images or reports available.
FINDINGS: Breast composition: b. Scattered fibroglandular tissue.

Background parenchymal enhancement: Minimal.

Right breast: Stable postsurgical scarring laterally. No mass or
abnormal enhancement.

Left breast: Stable postsurgical scarring laterally. No mass or
abnormal enhancement.

Lymph nodes: No abnormal appearing lymph nodes.

Ancillary findings: Partially included 3.5 cm right lobe liver cyst.
This is not as well visualized as on the previous examination and
the visualized portions are grossly unchanged.
IMPRESSION: No evidence of malignancy.

RECOMMENDATION:
Annual screening mammography.

BI-RADS CATEGORY  2: Benign.
